# Patient Record
Sex: Female | Born: 1965 | Race: Black or African American | Hispanic: No | Marital: Married | Smoking: Never smoker
Health system: Southern US, Community
[De-identification: ages and names within clinical notes are randomized; demographics above are authoritative.]

## PROBLEM LIST (undated history)

## (undated) DIAGNOSIS — G4700 Insomnia, unspecified: Secondary | ICD-10-CM

## (undated) HISTORY — DX: Insomnia, unspecified: G47.00

---

## 2000-06-05 ENCOUNTER — Other Ambulatory Visit: Admission: RE | Admit: 2000-06-05 | Discharge: 2000-06-05 | Payer: Self-pay | Admitting: Gynecology

## 2001-06-13 ENCOUNTER — Other Ambulatory Visit: Admission: RE | Admit: 2001-06-13 | Discharge: 2001-06-13 | Payer: Self-pay | Admitting: Gynecology

## 2002-06-16 ENCOUNTER — Other Ambulatory Visit: Admission: RE | Admit: 2002-06-16 | Discharge: 2002-06-16 | Payer: Self-pay | Admitting: Gynecology

## 2003-07-08 ENCOUNTER — Other Ambulatory Visit: Admission: RE | Admit: 2003-07-08 | Discharge: 2003-07-08 | Payer: Self-pay | Admitting: Gynecology

## 2003-07-27 ENCOUNTER — Encounter: Admission: RE | Admit: 2003-07-27 | Discharge: 2003-07-27 | Payer: Self-pay | Admitting: Gynecology

## 2004-07-12 ENCOUNTER — Other Ambulatory Visit: Admission: RE | Admit: 2004-07-12 | Discharge: 2004-07-12 | Payer: Self-pay | Admitting: Gynecology

## 2005-05-14 ENCOUNTER — Inpatient Hospital Stay (HOSPITAL_COMMUNITY): Admission: AD | Admit: 2005-05-14 | Discharge: 2005-05-14 | Payer: Self-pay | Admitting: Obstetrics and Gynecology

## 2005-05-28 ENCOUNTER — Inpatient Hospital Stay (HOSPITAL_COMMUNITY): Admission: AD | Admit: 2005-05-28 | Discharge: 2005-05-31 | Payer: Self-pay | Admitting: Obstetrics and Gynecology

## 2005-05-29 ENCOUNTER — Encounter (INDEPENDENT_AMBULATORY_CARE_PROVIDER_SITE_OTHER): Payer: Self-pay | Admitting: Specialist

## 2005-08-23 ENCOUNTER — Other Ambulatory Visit: Admission: RE | Admit: 2005-08-23 | Discharge: 2005-08-23 | Payer: Self-pay | Admitting: Obstetrics and Gynecology

## 2006-12-06 ENCOUNTER — Encounter: Admission: RE | Admit: 2006-12-06 | Discharge: 2006-12-06 | Payer: Self-pay | Admitting: Internal Medicine

## 2008-03-13 ENCOUNTER — Encounter: Admission: RE | Admit: 2008-03-13 | Discharge: 2008-03-13 | Payer: Self-pay | Admitting: Internal Medicine

## 2009-03-30 ENCOUNTER — Encounter: Admission: RE | Admit: 2009-03-30 | Discharge: 2009-03-30 | Payer: Self-pay | Admitting: Obstetrics and Gynecology

## 2010-09-17 ENCOUNTER — Encounter: Payer: Self-pay | Admitting: Gynecology

## 2010-09-18 ENCOUNTER — Encounter: Payer: Self-pay | Admitting: Obstetrics and Gynecology

## 2010-09-19 ENCOUNTER — Encounter: Payer: Self-pay | Admitting: Obstetrics and Gynecology

## 2011-01-13 NOTE — Op Note (Signed)
NAMEELFREDA, BLANCHET             ACCOUNT NO.:  [id number]   MEDICAL RECORD NO.:  [id number]          PATIENT TYPE:  INP   LOCATION:  9147                          FACILITY:  [facility]   PHYSICIAN:  Zenaida Niece, M.D.DATE OF BIRTH:  07-Apr-1966   DATE OF PROCEDURE:  05/29/2005  DATE OF DISCHARGE:                                 OPERATIVE REPORT   PREOPERATIVE DIAGNOSES:  1.  Intrauterine pregnancy at 38 weeks.  2.  Previous cesarean section.  3.  Third trimester vaginal bleeding.  4.  Desires surgical sterility.   POSTOPERATIVE DIAGNOSES:  1.  Intrauterine pregnancy at 38 weeks.  2.  Previous cesarean section.  3.  Third trimester vaginal bleeding.  4.  Desires surgical sterility.   PROCEDURE:  Repeat low transverse cesarean section and bilateral partial  salpingectomy.   SURGEON:  Zenaida Niece, M.D.   ANESTHESIA:  Spinal.   SPECIMENS:  Placenta.   ESTIMATED BLOOD LOSS:  1000 cc.   COMPLICATIONS:  She had bleeding from the placental bed and the right angle  of the uterine incision. Both fallopian tubes were also plump and difficult  to ligate. She delivered a viable female infant with Apgars of 9 and 9,  weight 6 pounds 6 ounces.   PROCEDURE IN DETAIL:  The patient was taken to the operating room and placed  in the sitting position. Dr. Tacy Dura instilled spinal anesthesia and she was  then placed in the dorsal supine position with left lateral tilt. Abdomen  was prepped and draped in the usual sterile fashion and a Foley catheter was  placed. The level of her anesthesia was found to be adequate and her abdomen  was entered via her previous Pfannenstiel incision. The self retaining  abdominal wound retractor was placed and the bladder pushed inferior. A 4 cm  transverse incision was made in a fairly thick lower uterine segment. Once  the amniotic cavity was entered, the incision was extended bilaterally  digitally and with bandage scissors. The fetal vertex was  grasped and  delivered through the incision atraumatically. Mouth and nares were  suctioned. The remainder of the infant then delivered atraumatically. Cord  was doubly clamped and cut and the infant handed to the awaiting pediatric  team. Cord blood was obtained and placenta delivered spontaneously. Uterus  was wiped dry with clean lap pad. Brisk bleeding from the inferior portion  of the incision was controlled with ring forceps. Despite pressure, there  was persistent bleeding from the placental bed posteriorly just above the  cervix. This was controlled with one figure-of-eight suture of #1 chromic.  The uterine incision was then closed in one layer being a running locking  layer with #1 chromic. This achieved hemostasis except for the right angle.  Several sutures of #1 chromic were used to obtain hemostasis at the right  angle. At least a portion of the uterine artery was occluded during the  sutures. Once the incision was felt to be adequately hemostatic, attention  was turned to the tubal ligation.   The left tube was brought to the incision. It had some adhesions to  the  ovary and was fairly thick, so these adhesions were taken down from the  ovary with electrocautery. In doing so, a small hole was made in one of the  large veins. This was controlled with interrupted figure-of-eight sutures of  3-0 Vicryl. The middle portion of the tube was then held up with a Babcock  clamp and a suture of 0 plain gut suture was wrapped around it. A second  suture was wrapped around it to ligate a knuckle of tube. The knuckle of  tube was then removed sharply. The tube promptly pulled out of the suture.  Both ends of fallopian tube were then grasped with Kelly clamps. Each end  was then separately ligated with 0 plain gut suture with adequate  hemostasis. A small amount of bleeding from between the two ends of the tube  was controlled with electrocautery.   Attention was turned to the right  tube. This tube also was fairly thick and  a knuckle of tube was grasped with a Babcock clamp. A 0 plain gut suture was  used to ligate the knuckle with two sutures. The knuckle of tube was then  sharply removed and on this side also, the tube promptly pulled loose from  the suture. Both ends were grasped and ligated separately with 0 plain gut  suture. Bleeding was controlled with these free ties. Careful inspection  revealed no active bleeding. Uterine incision was then again inspected.  Further bleeding from the right angle was controlled with one last suture of  #1 chromic. Bleeding from serosal edges and the inferior portion of the  incision was controlled with electrocautery. The subfascial space was then  irrigated and made hemostatic with electrocautery. Fascia was closed in  running fashion starting at both ends and meeting in the middle.  Subcutaneous tissue was irrigated and made hemostatic with electrocautery.  The skin was then closed with staples. A sterile dressing was then applied.  The patient tolerated the procedure well and was taken to the recovery room  in stable condition. Counts were correct x2, she was given Ancef 1 g after  cord clamp.      Zenaida Niece, M.D.  Electronically Signed     TDM/MEDQ  D:  05/29/2005  T:  05/29/2005  Job:  [id number]

## 2011-01-13 NOTE — Discharge Summary (Signed)
[name], [name]             ACCOUNT NO.:  [id number]   MEDICAL RECORD NO.:  [id number]          PATIENT TYPE:  INP   LOCATION:  9147                          FACILITY:  [facility]   PHYSICIAN:  Zenaida Niece, M.D.DATE OF BIRTH:  1965-11-30   DATE OF ADMISSION:  05/28/2005  DATE OF DISCHARGE:                                 DISCHARGE SUMMARY   ADMISSION DIAGNOSES:  1.  Intrauterine pregnancy at 38 weeks.  2.  Previous cesarean section.  3.  Third trimester bleeding.  4.  Desires surgical sterility.   DISCHARGE DIAGNOSES:  1.  Intrauterine pregnancy at 38 weeks.  2.  Previous cesarean section.  3.  Third trimester bleeding.  4.  Desires surgical sterility.   PROCEDURE:  On May 29, 2005 she had a repeat low transverse cesarean  section and bilateral partial salpingectomy.   COMPLICATIONS:  None.   HISTORY AND PHYSICAL:  Please see the chart for the full history and  physical but briefly, this is a 45 year old black female gravida 2 para 1-0-  0-1 with an EGA of [redacted] weeks who presents with the complaint of brisk vaginal  bleeding. She had been followed in the office and had a resolved placenta  previa and a possible persistent vasa previa. Prenatal care was otherwise  uncomplicated. Prenatal labs:  Blood type is O positive with a negative  antibody screen, RPR nonreactive, rubella immune, hepatitis B surface  antigen negative, gonorrhea and chlamydia negative, HIV was negative, 1-hour  Glucola was 93, and group B strep is negative. Past OB history:  Low-  transverse cesarean section at 40 weeks, baby weighed 7 pounds 7 ounces.  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 Past surgical history:  Cesarean section. Physical  exam:  She was afebrile with stable vital signs. Fetal heart tracing was  reactive. Abdomen was gravid, nontender, with a transverse scar and  estimated fetal weight of 7-and-a-half pounds. Vaginal exam  was deferred but  there was gross blood.   HOSPITAL COURSE:  The patient was admitted and taken to the operating room  where she underwent repeat low transverse cesarean section and tubal  ligation. Estimated blood loss was 1000 cc. She delivered a viable female  infant with Apgars of 9 and 9 that weighed 6 pounds 6 ounces.  Postoperatively, she did well without significant complications. Predelivery  hemoglobin 12.9 and postoperative day #0 was 10.1, and postoperative #1 it  was 8.9. She remained hemodynamically stable and on postpartum #2 requested  discharge home. She was felt to be stable enough for discharge home. Prior  to discharge her staples were removed and Steri-Strips applied and her  incision was healing well.   DISCHARGE INSTRUCTIONS:  Regular diet, pelvic rest, no strenuous activity.  Follow-up is in 2 weeks for an incision check. Medications are Percocet #30  one to two p.o. q.4-6h. p.r.n. pain, Ambien 10 mg #15 one p.o. q.h.s.  p.r.n., and over-the-counter ibuprofen as needed. She is also given our  discharge pamphlet.      Zenaida Niece, M.D.  Electronically  Signed     TDM/MEDQ  D:  05/31/2005  T:  05/31/2005  Job:  [id number]

## 2011-05-05 ENCOUNTER — Other Ambulatory Visit: Payer: Self-pay | Admitting: Obstetrics and Gynecology

## 2011-05-05 DIAGNOSIS — Z1231 Encounter for screening mammogram for malignant neoplasm of breast: Secondary | ICD-10-CM

## 2011-05-22 ENCOUNTER — Ambulatory Visit: Payer: Self-pay

## 2012-06-06 ENCOUNTER — Ambulatory Visit
Admission: RE | Admit: 2012-06-06 | Discharge: 2012-06-06 | Disposition: A | Discharge status: Home / Self Care | Payer: BC Managed Care – PPO | Source: Ambulatory Visit | Attending: Obstetrics and Gynecology | Admitting: Obstetrics and Gynecology

## 2012-06-06 DIAGNOSIS — Z1231 Encounter for screening mammogram for malignant neoplasm of breast: Secondary | ICD-10-CM

## 2013-05-07 ENCOUNTER — Other Ambulatory Visit: Payer: Self-pay

## 2013-05-07 ENCOUNTER — Other Ambulatory Visit: Payer: Self-pay | Admitting: Physical Therapy

## 2013-05-07 DIAGNOSIS — Z1231 Encounter for screening mammogram for malignant neoplasm of breast: Secondary | ICD-10-CM

## 2013-06-09 ENCOUNTER — Ambulatory Visit
Admission: RE | Admit: 2013-06-09 | Discharge: 2013-06-09 | Disposition: A | Discharge status: Home / Self Care | Payer: BC Managed Care – PPO | Source: Ambulatory Visit

## 2013-06-09 DIAGNOSIS — Z1231 Encounter for screening mammogram for malignant neoplasm of breast: Secondary | ICD-10-CM

## 2018-11-13 ENCOUNTER — Other Ambulatory Visit: Payer: Self-pay | Admitting: Obstetrics and Gynecology

## 2018-11-13 DIAGNOSIS — R928 Other abnormal and inconclusive findings on diagnostic imaging of breast: Secondary | ICD-10-CM

## 2018-11-18 ENCOUNTER — Other Ambulatory Visit: Payer: Self-pay

## 2018-11-18 ENCOUNTER — Ambulatory Visit: Payer: Self-pay

## 2018-11-18 ENCOUNTER — Ambulatory Visit
Admission: RE | Admit: 2018-11-18 | Discharge: 2018-11-18 | Disposition: A | Discharge status: Home / Self Care | Payer: BLUE CROSS/BLUE SHIELD | Source: Ambulatory Visit | Attending: Obstetrics and Gynecology | Admitting: Obstetrics and Gynecology

## 2018-11-18 DIAGNOSIS — R928 Other abnormal and inconclusive findings on diagnostic imaging of breast: Secondary | ICD-10-CM

## 2018-11-20 ENCOUNTER — Other Ambulatory Visit: Payer: Self-pay

## 2018-12-11 DIAGNOSIS — F5101 Primary insomnia: Secondary | ICD-10-CM | POA: Diagnosis not present

## 2019-01-23 DIAGNOSIS — F411 Generalized anxiety disorder: Secondary | ICD-10-CM | POA: Diagnosis not present

## 2019-07-18 DIAGNOSIS — Z20828 Contact with and (suspected) exposure to other viral communicable diseases: Secondary | ICD-10-CM | POA: Diagnosis not present

## 2019-08-09 IMAGING — MG DIGITAL DIAGNOSTIC UNILATERAL LEFT MAMMOGRAM WITH TOMO AND CAD
4 series · 4 of 12 positions shown · non-contrast
Comparison: November 07, 2018

CLINICAL DATA: 52-year-old patient recalled from recent 2D
screening mammogram for evaluation of possible asymmetry in the left
breast.

EXAM:
DIGITAL DIAGNOSTIC UNILATERAL LEFT MAMMOGRAM WITH CAD AND TOMO

[L MLO synth-2D]
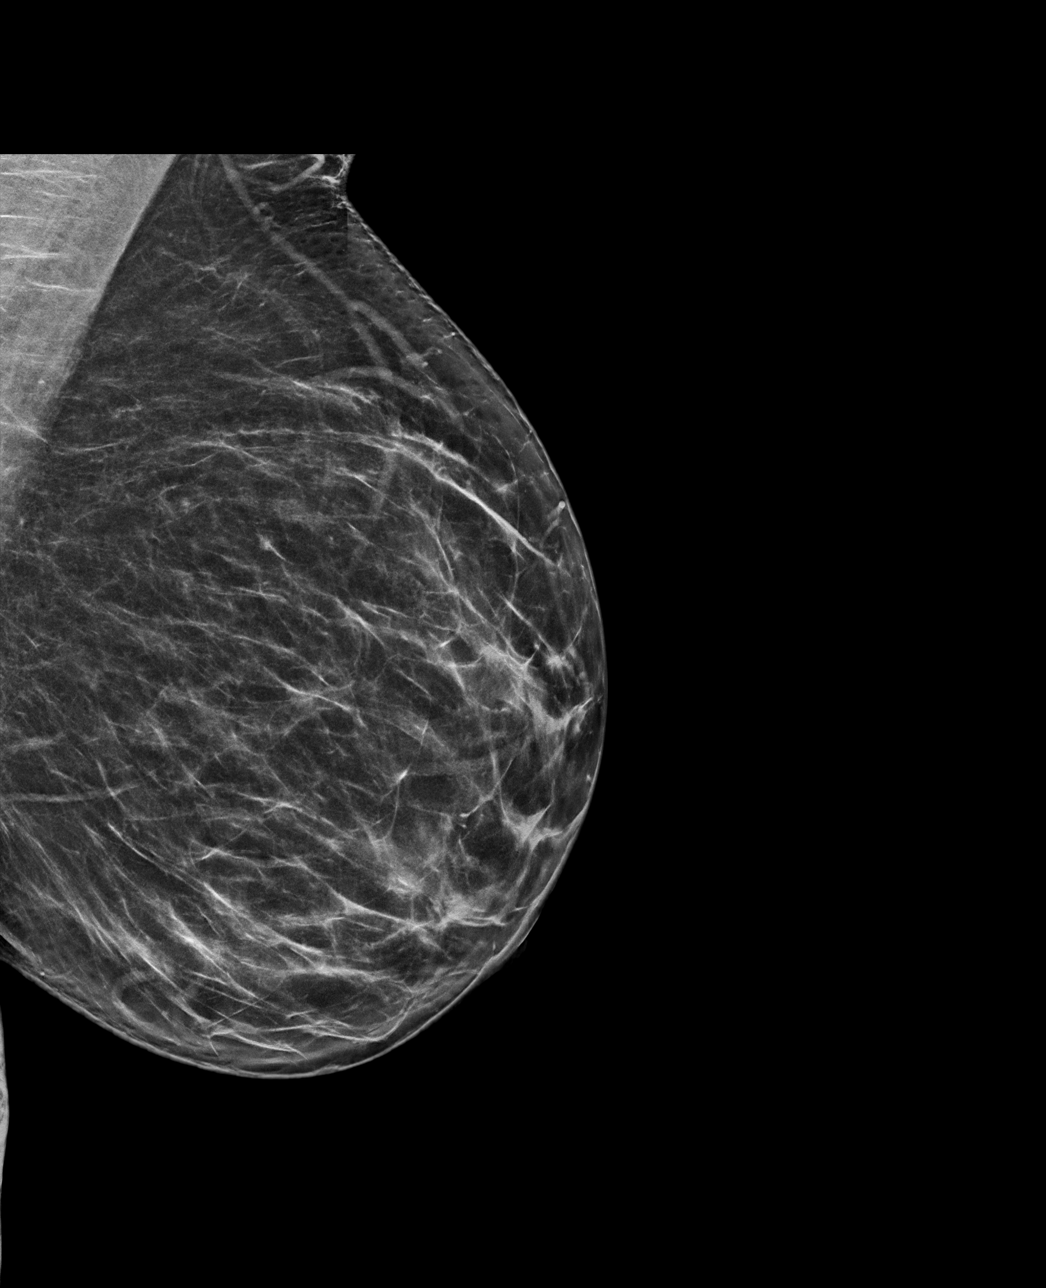

[L CC synth-2D]
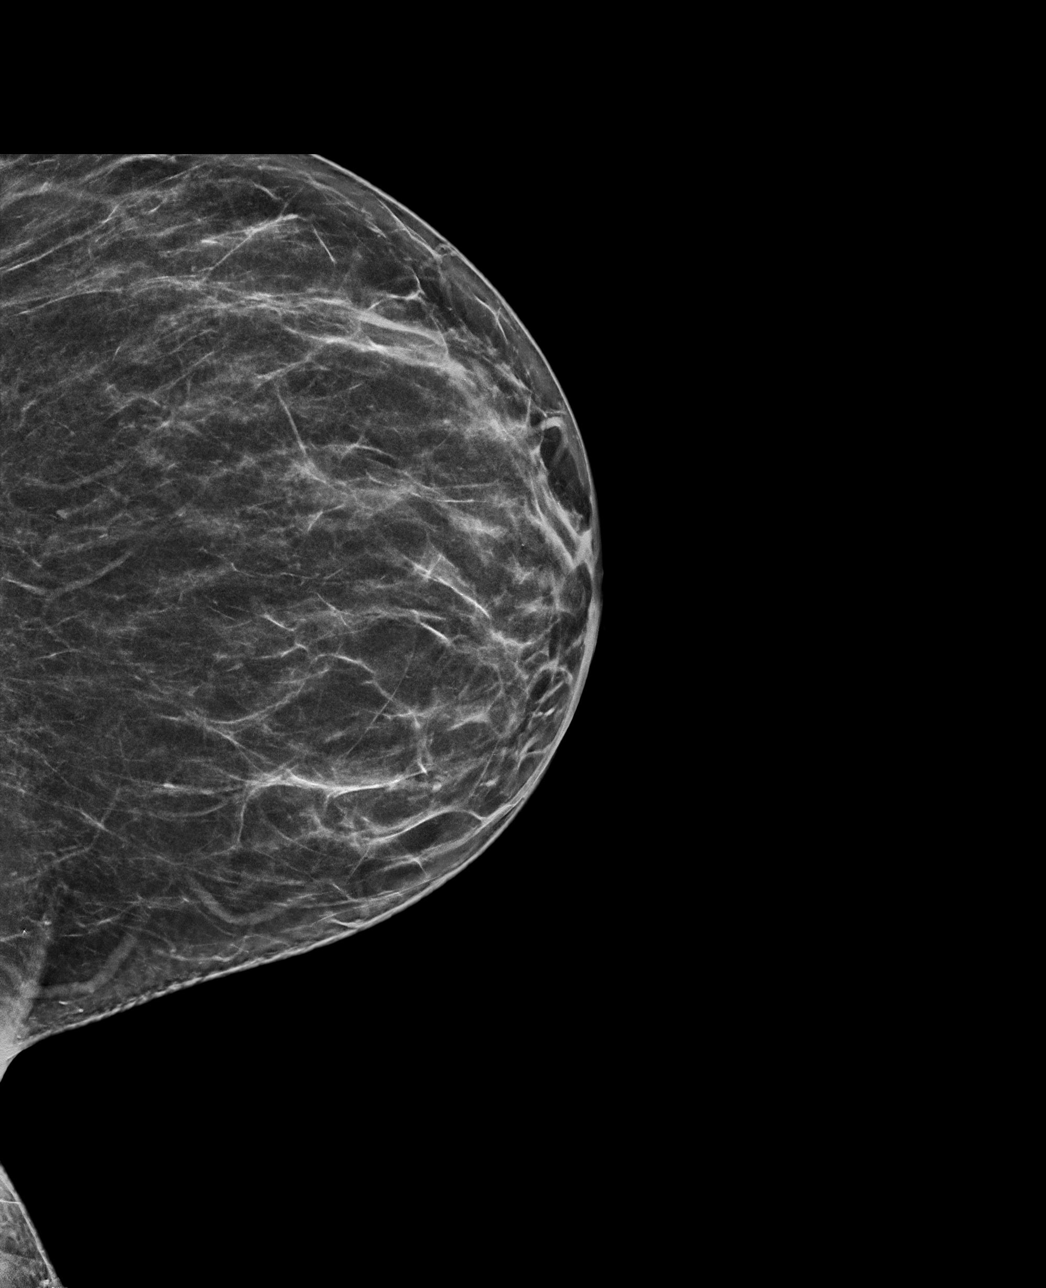

[L CC tomo · tomo slice 35/70.0]
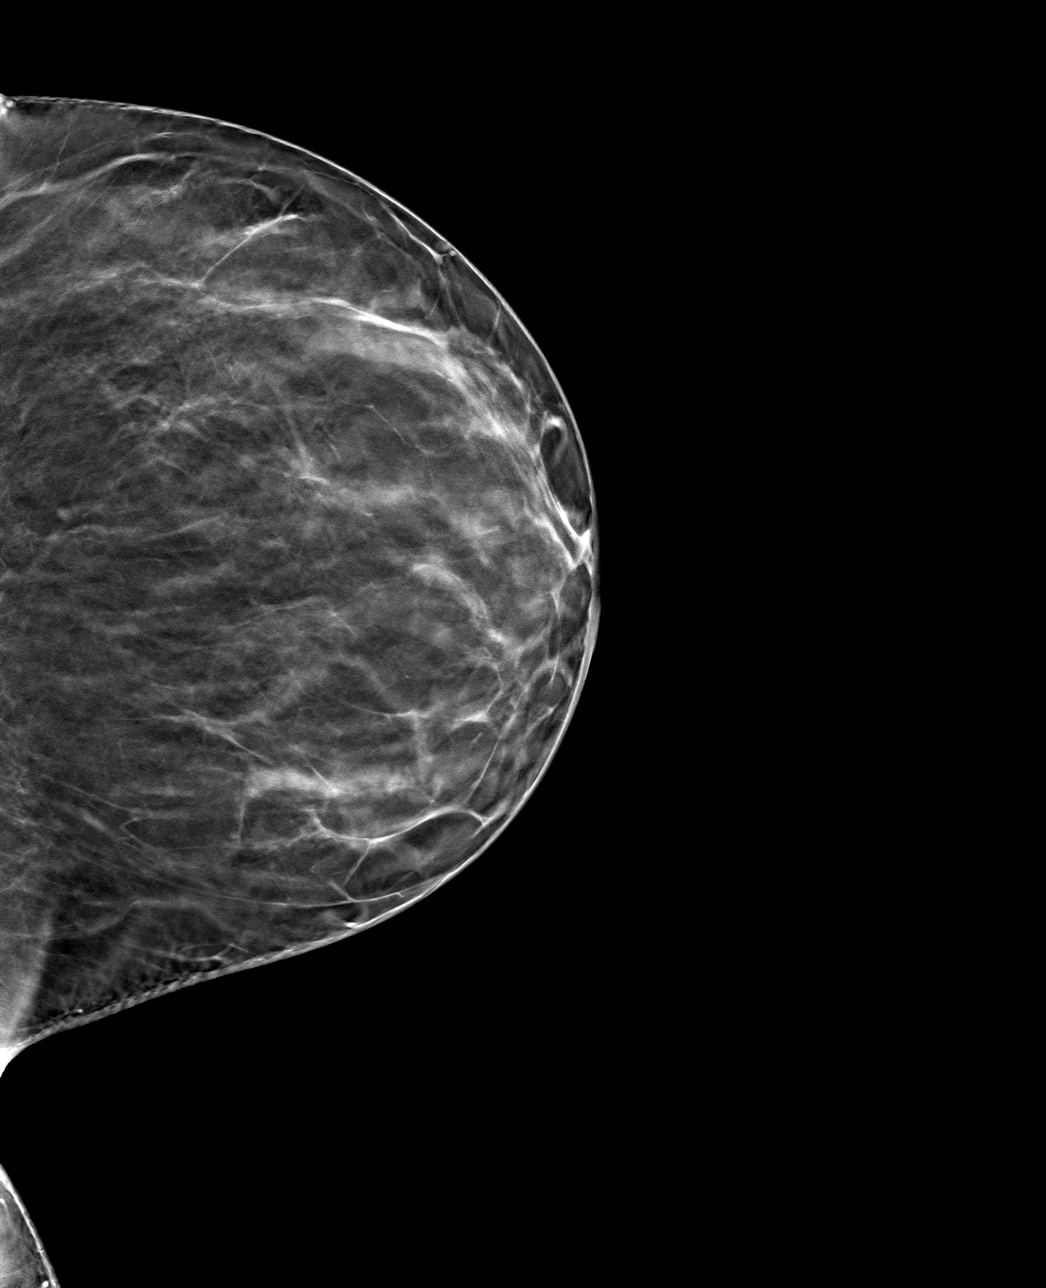

[L MLO tomo · tomo slice 38/75.0]
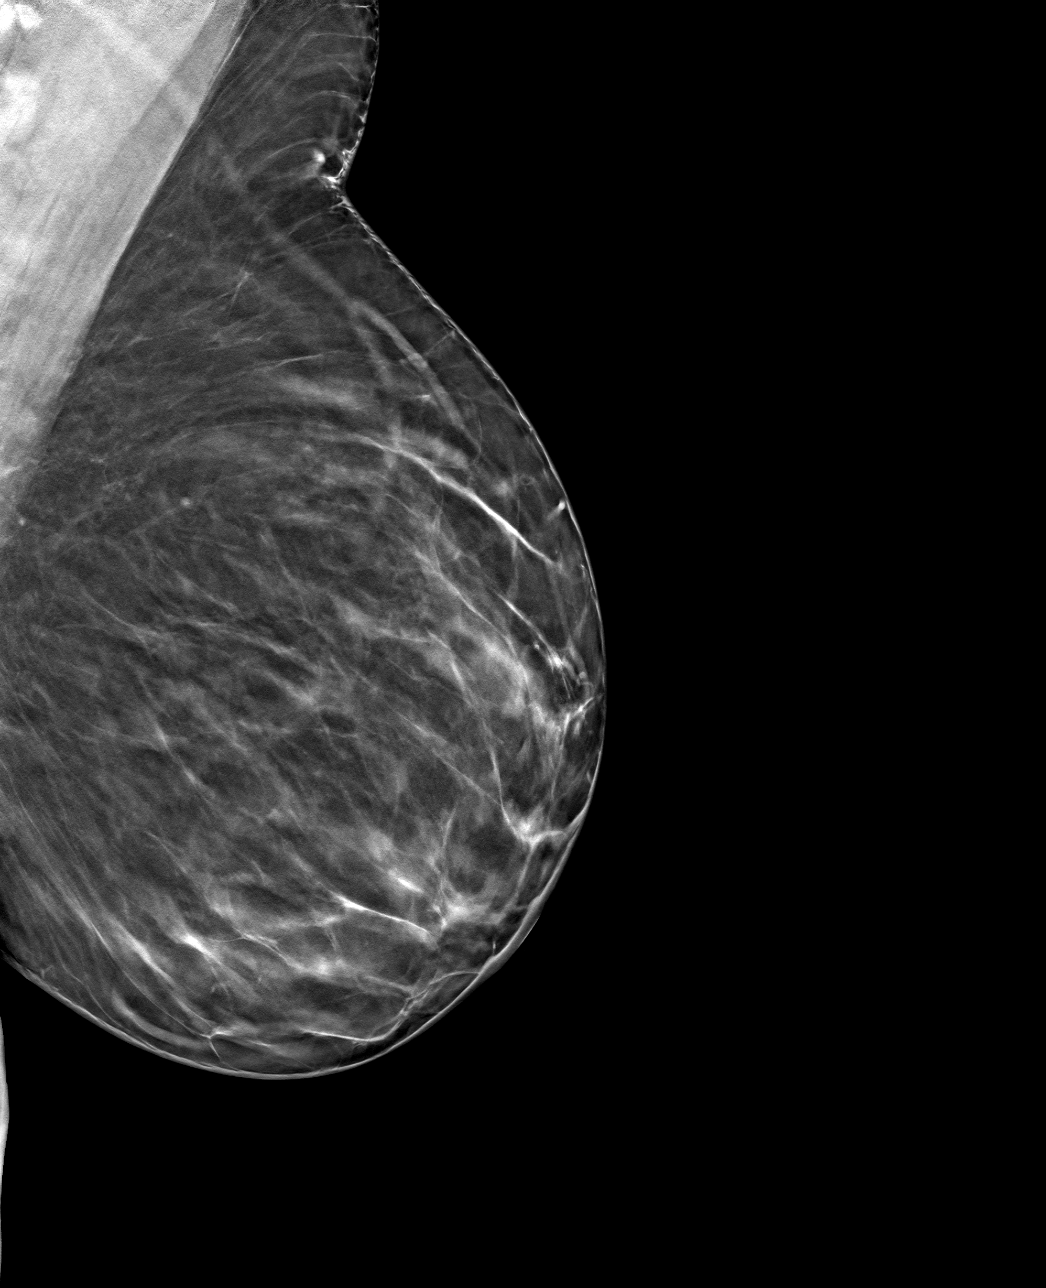

[4 of 12 positions shown; findings below may reference images not displayed]

ACR Breast Density Category b: There are scattered areas of
fibroglandular density.
FINDINGS: Whole breast CC and MLO views with tomography show no persistent
asymmetry in the left breast. There is no mass or distortion. No
suspicious findings.

Mammographic images were processed with CAD.
IMPRESSION: No evidence of malignancy in the left breast.

RECOMMENDATION:
Screening mammogram in one year.(Code:QG-7-QZ6)

I have discussed the findings and recommendations with the patient.
Results were also provided in writing at the conclusion of the
visit. If applicable, a reminder letter will be sent to the patient
regarding the next appointment.

BI-RADS CATEGORY  1: Negative.

## 2019-08-28 DIAGNOSIS — F411 Generalized anxiety disorder: Secondary | ICD-10-CM | POA: Diagnosis not present

## 2019-08-28 DIAGNOSIS — F5101 Primary insomnia: Secondary | ICD-10-CM | POA: Diagnosis not present

## 2019-09-10 DIAGNOSIS — U071 COVID-19: Secondary | ICD-10-CM | POA: Diagnosis not present

## 2019-09-19 DIAGNOSIS — Z20822 Contact with and (suspected) exposure to covid-19: Secondary | ICD-10-CM | POA: Diagnosis not present

## 2019-09-21 DIAGNOSIS — Z20822 Contact with and (suspected) exposure to covid-19: Secondary | ICD-10-CM | POA: Diagnosis not present

## 2019-11-18 DIAGNOSIS — F411 Generalized anxiety disorder: Secondary | ICD-10-CM | POA: Diagnosis not present

## 2019-11-27 DIAGNOSIS — E559 Vitamin D deficiency, unspecified: Secondary | ICD-10-CM

## 2019-11-27 HISTORY — DX: Vitamin D deficiency, unspecified: E55.9

## 2019-11-28 DIAGNOSIS — G4700 Insomnia, unspecified: Secondary | ICD-10-CM | POA: Diagnosis not present

## 2019-11-28 DIAGNOSIS — R030 Elevated blood-pressure reading, without diagnosis of hypertension: Secondary | ICD-10-CM | POA: Diagnosis not present

## 2019-11-28 DIAGNOSIS — B001 Herpesviral vesicular dermatitis: Secondary | ICD-10-CM | POA: Diagnosis not present

## 2019-11-28 DIAGNOSIS — Z634 Disappearance and death of family member: Secondary | ICD-10-CM | POA: Diagnosis not present

## 2019-12-19 ENCOUNTER — Ambulatory Visit: Payer: BLUE CROSS/BLUE SHIELD | Admitting: Family Medicine

## 2019-12-26 ENCOUNTER — Ambulatory Visit (INDEPENDENT_AMBULATORY_CARE_PROVIDER_SITE_OTHER): Payer: Self-pay | Admitting: Family Medicine

## 2019-12-26 ENCOUNTER — Other Ambulatory Visit: Payer: Self-pay

## 2019-12-26 ENCOUNTER — Encounter: Payer: Self-pay | Admitting: Family Medicine

## 2019-12-26 VITALS — BP 159/88 | HR 110 | Temp 97.8°F | Ht 65.0 in | Wt 203.8 lb

## 2019-12-26 DIAGNOSIS — Z7689 Persons encountering health services in other specified circumstances: Secondary | ICD-10-CM

## 2019-12-26 DIAGNOSIS — R8290 Unspecified abnormal findings in urine: Secondary | ICD-10-CM | POA: Diagnosis not present

## 2019-12-26 DIAGNOSIS — F4321 Adjustment disorder with depressed mood: Secondary | ICD-10-CM

## 2019-12-26 DIAGNOSIS — Z0000 Encounter for general adult medical examination without abnormal findings: Secondary | ICD-10-CM | POA: Diagnosis not present

## 2019-12-26 DIAGNOSIS — R030 Elevated blood-pressure reading, without diagnosis of hypertension: Secondary | ICD-10-CM

## 2019-12-26 DIAGNOSIS — Z09 Encounter for follow-up examination after completed treatment for conditions other than malignant neoplasm: Secondary | ICD-10-CM

## 2019-12-26 DIAGNOSIS — F419 Anxiety disorder, unspecified: Secondary | ICD-10-CM

## 2019-12-26 HISTORY — DX: Elevated blood-pressure reading, without diagnosis of hypertension: R03.0

## 2019-12-26 LAB — GLUCOSE, POCT (MANUAL RESULT ENTRY): POC Glucose: 105 mg/dl — AB (ref 70–99)

## 2019-12-26 LAB — POCT URINALYSIS DIPSTICK
Bilirubin, UA: NEGATIVE
Blood, UA: NEGATIVE
Glucose, UA: NEGATIVE
Ketones, UA: NEGATIVE
Nitrite, UA: NEGATIVE
Protein, UA: NEGATIVE
Spec Grav, UA: 1.02 (ref 1.010–1.025)
Urobilinogen, UA: 0.2 E.U./dL
pH, UA: 6 (ref 5.0–8.0)

## 2019-12-26 LAB — POCT GLYCOSYLATED HEMOGLOBIN (HGB A1C): Hemoglobin A1C: 5.2 % (ref 4.0–5.6)

## 2019-12-26 NOTE — Progress Notes (Signed)
Patient [name] and [facility]  New Patient--Establish Care  Subjective:  Patient ID: [name], female    DOB: 05-11-1966  Age: 54 y.o. MRN: [id number]  CC:  Chief Complaint  Patient presents with  . New Patient (Initial Visit)    Est care    HPI [name] is a 54 year old female who presents for Follow Up today.    Past Medical History:  Diagnosis Date  . Elevated blood pressure reading 12/26/2019  . Insomnia      Current Status: This will be her initial office visit with me. She was previously seeing Eugenie Norrie, MD for her PCP needs. Since her last office visit, she is doing well with no complaints. Her last colonoscopy was in 2019 Last Mammogram was in 2020. She recently lost her mother 09/2019, and is having a hard time with her grief. She is extremely tearful and emotional today. She denies fevers, chills, fatigue, recent infections, weight loss, and night sweats. She has not had any headaches, visual changes, dizziness, and falls. No chest pain, heart palpitations, cough and shortness of breath reported. No reports of GI problems such as nausea, vomiting, diarrhea, and constipation. She has no reports of blood in stools, dysuria and hematuria. No depression or anxiety, and denies suicidal ideations, homicidal ideations, or auditory hallucinations. She is taking all medications as prescribed. She denies pain today.    Family History  Problem Relation Age of Onset  . Heart disease Mother   . COPD Father   . Heart disease Sister     Social History   Socioeconomic History  . Marital status: Married    Spouse name: Not on file  . Number of children: Not on file  . Years of education: Not on file  . Highest education level: Not on file  Occupational History  . Not on file  Tobacco Use  . Smoking status: Never Smoker  . Smokeless tobacco: Never Used  Substance and Sexual Activity  . Alcohol use: Yes  . Drug  use: Not Currently  . Sexual activity: Yes    Birth control/protection: I.U.D.  Other Topics Concern  . Not on file  Social History Narrative  . Not on file   Social Determinants of Health   Financial Resource Strain:   . Difficulty of Paying Living Expenses:   Food Insecurity:   . Worried About Charity fundraiser in the Last Year:   . Arboriculturist in the Last Year:   Transportation Needs:   . Film/video editor (Medical):   Marland Kitchen Lack of Transportation (Non-Medical):   Physical Activity:   . Days of Exercise per Week:   . Minutes of Exercise per Session:   Stress:   . Feeling of Stress :   Social Connections:   . Frequency of Communication with Friends and Family:   . Frequency of Social Gatherings with Friends and Family:   . Attends Religious Services:   . Active Member of Clubs or Organizations:   . Attends Archivist Meetings:   Marland Kitchen Marital Status:   Intimate Partner Violence:   . Fear of Current or Ex-Partner:   . Emotionally Abused:   Marland Kitchen Physically Abused:   . Sexually Abused:     Outpatient Medications Prior to Visit  Medication Sig Dispense Refill  . etonogestrel-ethinyl estradiol (NUVARING) 0.12-0.015 MG/24HR vaginal ring NuvaRing 0.12 mg-0.015 mg/24 hr vaginal  INSERT 1 VAGINAL RING VAGINALLY EVERY MONTH    .  zolpidem (AMBIEN) 10 MG tablet Take 10 mg by mouth at bedtime.     No facility-administered medications prior to visit.    No Known Allergies  ROS Review of Systems  Constitutional: Negative.   HENT: Negative.   Eyes: Negative.   Respiratory: Negative.   Cardiovascular: Negative.   Gastrointestinal: Negative.   Endocrine: Negative.   Genitourinary: Negative.   Musculoskeletal: Negative.   Skin: Negative.   Allergic/Immunologic: Negative.   Neurological: Negative.   Hematological: Negative.   Psychiatric/Behavioral: The patient is nervous/anxious.        Very tearful today.       Objective:    Physical Exam   Constitutional: She is oriented to person, place, and time. She appears well-developed and well-nourished.  HENT:  Head: Normocephalic and atraumatic.  Eyes: Conjunctivae are normal.  Cardiovascular: Normal rate, regular rhythm, normal heart sounds and intact distal pulses.  Pulmonary/Chest: Effort normal and breath sounds normal.  Abdominal: Soft. Bowel sounds are normal.  Musculoskeletal:        General: Normal range of motion.     Cervical back: Normal range of motion and neck supple.  Neurological: She is alert and oriented to person, place, and time. She has normal reflexes.  Skin: Skin is warm and dry.  Psychiatric:  Tearful   Nursing note and vitals reviewed.   BP (!) 159/88   Pulse (!) 110   Temp 97.8 F (36.6 C)   Ht 5' 5"  (1.651 m)   Wt 203 lb 12.8 oz (92.4 kg)   SpO2 97%   BMI 33.91 kg/m  Wt Readings from Last 3 Encounters:  12/26/19 203 lb 12.8 oz (92.4 kg)    Health Maintenance Due  Topic Date Due  . HIV Screening  Never done  . COVID-19 Vaccine (1) Never done  . TETANUS/TDAP  Never done  . PAP SMEAR-Modifier  Never done  . MAMMOGRAM  11/18/2017  . COLONOSCOPY  Never done    There are no preventive care reminders to display for this patient.  No results found for: TSH No results found for: WBC, HGB, HCT, MCV, PLT No results found for: NA, K, CHLORIDE, CO2, GLUCOSE, BUN, CREATININE, BILITOT, ALKPHOS, AST, ALT, PROT, ALBUMIN, CALCIUM, ANIONGAP, EGFR, GFR No results found for: CHOL No results found for: HDL No results found for: LDLCALC No results found for: TRIG No results found for: CHOLHDL Lab Results  Component Value Date   HGBA1C 5.2 12/26/2019      Assessment & Plan:   1. Grieving  we will refer to Hospice for grief. - Ambulatory referral to Psychiatry  2. Anxiety - Ambulatory referral to Psychiatry  3. Elevated blood pressure reading Blood pressure elevated today. Monitor.   4. Encounter to establish care  5. Abnormal  urinalysis Results are pending.  - Urine Culture  6. Healthcare maintenance - POCT urinalysis dipstick - POCT glycosylated hemoglobin (Hb A1C) - POCT glucose (manual entry) - CBC with Differential - Comprehensive metabolic panel - Lipid Panel - TSH - Vitamin B12 - Vitamin D, 25-hydroxy  7. Follow up She wil follow up in 1 months.   ,No orders of the defined types were placed in this encounter.   Orders Placed This Encounter  Procedures  . Urine Culture  . CBC with Differential  . Comprehensive metabolic panel  . Lipid Panel  . TSH  . Vitamin B12  . Vitamin D, 25-hydroxy  . Ambulatory referral to Psychiatry  . POCT urinalysis dipstick  . POCT glycosylated  hemoglobin (Hb A1C)  . POCT glucose (manual entry)     Referral Orders     Ambulatory referral to Psychiatry   Kathe Becton,  MSN, FNP-BC [facility] [address] [place], [place] [address] [phone]- fax     Problem List Items Addressed This Visit    None    Visit Diagnoses    Grieving    -  Primary   Relevant Orders   Ambulatory referral to Psychiatry   Anxiety       Relevant Orders   Ambulatory referral to Psychiatry   Elevated blood pressure reading       Encounter to establish care       Abnormal urinalysis       Relevant Orders   Urine Culture   Healthcare maintenance       Relevant Orders   POCT urinalysis dipstick (Completed)   POCT glycosylated hemoglobin (Hb A1C) (Completed)   POCT glucose (manual entry) (Completed)   CBC with Differential   Comprehensive metabolic panel   Lipid Panel   TSH   Vitamin B12   Vitamin D, 25-hydroxy   Follow up          No orders of the defined types were placed in this encounter.   Follow-up: Return in about 1 month (around 01/25/2020).    Azzie Glatter, FNP

## 2019-12-27 LAB — VITAMIN D 25 HYDROXY (VIT D DEFICIENCY, FRACTURES): Vit D, 25-Hydroxy: 19.1 ng/mL — ABNORMAL LOW (ref 30.0–100.0)

## 2019-12-27 LAB — COMPREHENSIVE METABOLIC PANEL
ALT: 14 IU/L (ref 0–32)
AST: 34 IU/L (ref 0–40)
Albumin/Globulin Ratio: 1.4 (ref 1.2–2.2)
Albumin: 4.1 g/dL (ref 3.8–4.9)
Alkaline Phosphatase: 57 IU/L (ref 39–117)
BUN/Creatinine Ratio: 9 (ref 9–23)
BUN: 10 mg/dL (ref 6–24)
Bilirubin Total: 0.3 mg/dL (ref 0.0–1.2)
CO2: 21 mmol/L (ref 20–29)
Calcium: 9.2 mg/dL (ref 8.7–10.2)
Chloride: 105 mmol/L (ref 96–106)
Creatinine, Ser: 1.13 mg/dL — ABNORMAL HIGH (ref 0.57–1.00)
GFR calc Af Amer: 65 mL/min/{1.73_m2} (ref >59)
GFR calc non Af Amer: 56 mL/min/{1.73_m2} — ABNORMAL LOW (ref >59)
Globulin, Total: 2.9 g/dL (ref 1.5–4.5)
Glucose: 88 mg/dL (ref 65–99)
Potassium: 3.9 mmol/L (ref 3.5–5.2)
Sodium: 140 mmol/L (ref 134–144)
Total Protein: 7 g/dL (ref 6.0–8.5)

## 2019-12-27 LAB — CBC WITH DIFFERENTIAL/PLATELET
Basophils Absolute: 0.1 10*3/uL (ref 0.0–0.2)
Basos: 1 %
EOS (ABSOLUTE): 0.4 10*3/uL (ref 0.0–0.4)
Eos: 4 %
Hematocrit: 42.7 % (ref 34.0–46.6)
Hemoglobin: 14.4 g/dL (ref 11.1–15.9)
Immature Grans (Abs): 0 10*3/uL (ref 0.0–0.1)
Immature Granulocytes: 0 %
Lymphocytes Absolute: 3.6 10*3/uL — ABNORMAL HIGH (ref 0.7–3.1)
Lymphs: 35 %
MCH: 32.8 pg (ref 26.6–33.0)
MCHC: 33.7 g/dL (ref 31.5–35.7)
MCV: 97 fL (ref 79–97)
Monocytes Absolute: 0.7 10*3/uL (ref 0.1–0.9)
Monocytes: 6 %
Neutrophils Absolute: 5.7 10*3/uL (ref 1.4–7.0)
Neutrophils: 54 %
Platelets: 351 10*3/uL (ref 150–450)
RBC: 4.39 x10E6/uL (ref 3.77–5.28)
RDW: 13.1 % (ref 11.7–15.4)
WBC: 10.5 10*3/uL (ref 3.4–10.8)

## 2019-12-27 LAB — LIPID PANEL
Chol/HDL Ratio: 2.7 ratio (ref 0.0–4.4)
Cholesterol, Total: 146 mg/dL (ref 100–199)
HDL: 55 mg/dL (ref >39)
LDL Chol Calc (NIH): 68 mg/dL (ref 0–99)
Triglycerides: 135 mg/dL (ref 0–149)
VLDL Cholesterol Cal: 23 mg/dL (ref 5–40)

## 2019-12-27 LAB — VITAMIN B12: Vitamin B-12: 601 pg/mL (ref 232–1245)

## 2019-12-27 LAB — TSH: TSH: 0.955 u[IU]/mL (ref 0.450–4.500)

## 2019-12-28 LAB — URINE CULTURE

## 2019-12-29 ENCOUNTER — Other Ambulatory Visit: Payer: Self-pay | Admitting: Family Medicine

## 2019-12-29 ENCOUNTER — Encounter: Payer: Self-pay | Admitting: Family Medicine

## 2019-12-29 DIAGNOSIS — E559 Vitamin D deficiency, unspecified: Secondary | ICD-10-CM

## 2019-12-29 MED ORDER — VITAMIN D (ERGOCALCIFEROL) 1.25 MG (50000 UNIT) PO CAPS: 50000.0000 [IU] | ORAL_CAPSULE | ORAL | 3 refills | Status: DC

## 2020-01-08 ENCOUNTER — Telehealth: Payer: Self-pay | Admitting: Family Medicine

## 2020-01-08 NOTE — Telephone Encounter (Signed)
Pt called in Xanax you discussed that couldn't be filled until the 15th.

## 2020-01-09 ENCOUNTER — Other Ambulatory Visit: Payer: Self-pay | Admitting: Family Medicine

## 2020-01-09 DIAGNOSIS — F419 Anxiety disorder, unspecified: Secondary | ICD-10-CM

## 2020-01-09 DIAGNOSIS — F4321 Adjustment disorder with depressed mood: Secondary | ICD-10-CM

## 2020-01-09 MED ORDER — ALPRAZOLAM 0.5 MG PO TABS: 0.5000 mg | ORAL_TABLET | Freq: Every evening | ORAL | 0 refills | Status: DC | PRN

## 2020-01-09 NOTE — Telephone Encounter (Signed)
Msg left on voicemail.

## 2020-01-09 NOTE — Telephone Encounter (Signed)
Pt recently lost her mother to COVID. Pt said you suggested the medicine in case she needed it.

## 2020-01-27 ENCOUNTER — Ambulatory Visit: Payer: Self-pay | Admitting: Family Medicine

## 2020-02-03 ENCOUNTER — Ambulatory Visit: Payer: Self-pay | Admitting: Family Medicine

## 2020-02-09 DIAGNOSIS — Z1389 Encounter for screening for other disorder: Secondary | ICD-10-CM | POA: Diagnosis not present

## 2020-02-09 DIAGNOSIS — Z01419 Encounter for gynecological examination (general) (routine) without abnormal findings: Secondary | ICD-10-CM | POA: Diagnosis not present

## 2020-02-09 DIAGNOSIS — Z1231 Encounter for screening mammogram for malignant neoplasm of breast: Secondary | ICD-10-CM | POA: Diagnosis not present

## 2020-02-09 DIAGNOSIS — Z130 Encounter for screening for diseases of the blood and blood-forming organs and certain disorders involving the immune mechanism: Secondary | ICD-10-CM | POA: Diagnosis not present

## 2020-02-09 DIAGNOSIS — Z124 Encounter for screening for malignant neoplasm of cervix: Secondary | ICD-10-CM | POA: Diagnosis not present

## 2020-05-20 ENCOUNTER — Other Ambulatory Visit: Payer: Self-pay | Admitting: Family Medicine

## 2020-05-20 DIAGNOSIS — E559 Vitamin D deficiency, unspecified: Secondary | ICD-10-CM

## 2020-06-29 DIAGNOSIS — Z20822 Contact with and (suspected) exposure to covid-19: Secondary | ICD-10-CM | POA: Diagnosis not present

## 2020-07-12 ENCOUNTER — Other Ambulatory Visit: Payer: Self-pay | Admitting: Family Medicine

## 2020-07-12 DIAGNOSIS — E559 Vitamin D deficiency, unspecified: Secondary | ICD-10-CM

## 2020-07-12 DIAGNOSIS — Z1152 Encounter for screening for COVID-19: Secondary | ICD-10-CM | POA: Diagnosis not present

## 2020-07-14 ENCOUNTER — Ambulatory Visit (INDEPENDENT_AMBULATORY_CARE_PROVIDER_SITE_OTHER): Payer: Self-pay | Admitting: Family Medicine

## 2020-07-14 ENCOUNTER — Other Ambulatory Visit: Payer: Self-pay

## 2020-07-14 DIAGNOSIS — F419 Anxiety disorder, unspecified: Secondary | ICD-10-CM

## 2020-07-14 DIAGNOSIS — Z09 Encounter for follow-up examination after completed treatment for conditions other than malignant neoplasm: Secondary | ICD-10-CM

## 2020-07-14 DIAGNOSIS — R030 Elevated blood-pressure reading, without diagnosis of hypertension: Secondary | ICD-10-CM

## 2020-07-14 DIAGNOSIS — G4700 Insomnia, unspecified: Secondary | ICD-10-CM

## 2020-07-14 DIAGNOSIS — I10 Essential (primary) hypertension: Secondary | ICD-10-CM

## 2020-07-14 DIAGNOSIS — F4321 Adjustment disorder with depressed mood: Secondary | ICD-10-CM

## 2020-07-14 LAB — POCT GLYCOSYLATED HEMOGLOBIN (HGB A1C)
HbA1c POC (<> result, manual entry): 5.1 % (ref 4.0–5.6)
HbA1c, POC (controlled diabetic range): 5.1 % (ref 0.0–7.0)
HbA1c, POC (prediabetic range): 5.1 % — AB (ref 5.7–6.4)
Hemoglobin A1C: 5.1 % (ref 4.0–5.6)

## 2020-07-14 LAB — POCT URINALYSIS DIPSTICK
Bilirubin, UA: NEGATIVE
Glucose, UA: NEGATIVE
Leukocytes, UA: NEGATIVE
Nitrite, UA: NEGATIVE
Protein, UA: NEGATIVE
Spec Grav, UA: >=1.03 — AB (ref 1.010–1.025)
Urobilinogen, UA: 0.2 E.U./dL
pH, UA: 5.5 (ref 5.0–8.0)

## 2020-07-14 LAB — GLUCOSE, POCT (MANUAL RESULT ENTRY): POC Glucose: 149 mg/dl — AB (ref 70–99)

## 2020-07-14 MED ORDER — HYDROCHLOROTHIAZIDE 25 MG PO TABS: 25.0000 mg | ORAL_TABLET | Freq: Every day | ORAL | 1 refills | Status: DC

## 2020-07-14 MED ORDER — ALPRAZOLAM 0.5 MG PO TABS: 0.5000 mg | ORAL_TABLET | Freq: Every evening | ORAL | 0 refills | Status: DC | PRN

## 2020-07-14 NOTE — Progress Notes (Signed)
[facility]   Established Patient Office Visit  Subjective:  Patient ID: [name], female    DOB: 03-08-1966  Age: 54 y.o. MRN: [id number]  CC:  Chief Complaint  Patient presents with  . Follow-up    No sleep, no tatse or smell, dizzness, blurry vision, can't sleep @ nite,can't eat tightness in chest. X5 wks.. Pt also states she had covid-19 in 08/2019.  . Back Pain    X5 wks   HPI [name] is a 54 year old female who presents for Follow Up today.   There are no problems to display for this patient.  Current Status: Since her last office visit, she is doing well with no complaints. At her last and initial visit her blood pressure readings were elevated. She recently lost her Mother 09/2019. Her anxiety is moderate as r/t her grieving. She has had increased intermittent episodes of crying. She has not been able to get any sleep since. She denies visual changes, chest pain, cough, shortness of breath, heart palpitations, and falls. She has occasional headaches and dizziness with position changes. Denies severe headaches, confusion, seizures, double vision, and blurred vision, nausea and vomiting. She denies fevers, chills, fatigue, recent infections, weight loss, and night sweats. Denies GI problems such as diarrhea, and constipation. She has no reports of blood in stools, dysuria and hematuria. She is taking all medications as prescribed. She denies pain today.   Past Medical History:  Diagnosis Date  . Elevated blood pressure reading 12/26/2019  . Insomnia   . Vitamin D deficiency 11/2019    Family History  Problem Relation Age of Onset  . Heart disease Mother   . COPD Father   . Heart disease Sister     Social History   Socioeconomic History  . Marital status: Married    Spouse name: Not on file  . Number of children: Not on file  . Years of education: Not on file  . Highest education level: Not  on file  Occupational History  . Not on file  Tobacco Use  . Smoking status: Never Smoker  . Smokeless tobacco: Never Used  Vaping Use  . Vaping Use: Never used  Substance and Sexual Activity  . Alcohol use: Yes  . Drug use: Not Currently  . Sexual activity: Yes    Birth control/protection: I.U.D.  Other Topics Concern  . Not on file  Social History Narrative  . Not on file   Social Determinants of Health   Financial Resource Strain:   . Difficulty of Paying Living Expenses: Not on file  Food Insecurity:   . Worried About Programme researcher, broadcasting/film/video in the Last Year: Not on file  . Ran Out of Food in the Last Year: Not on file  Transportation Needs:   . Lack of Transportation (Medical): Not on file  . Lack of Transportation (Non-Medical): Not on file  Physical Activity:   . Days of Exercise per Week: Not on file  . Minutes of Exercise per Session: Not on file  Stress:   . Feeling of Stress : Not on file  Social Connections:   . Frequency of Communication with Friends and Family: Not on file  . Frequency of Social Gatherings with Friends and Family: Not on file  . Attends Religious Services: Not on file  . Active Member of Clubs or Organizations: Not on file  . Attends Banker Meetings: Not on file  .  Marital Status: Not on file  Intimate Partner Violence:   . Fear of Current or Ex-Partner: Not on file  . Emotionally Abused: Not on file  . Physically Abused: Not on file  . Sexually Abused: Not on file    Outpatient Medications Prior to Visit  Medication Sig Dispense Refill  . etonogestrel-ethinyl estradiol (NUVARING) 0.12-0.015 MG/24HR vaginal ring NuvaRing 0.12 mg-0.015 mg/24 hr vaginal  INSERT 1 VAGINAL RING VAGINALLY EVERY MONTH (Patient not taking: Reported on 07/14/2020)    . Vitamin D, Ergocalciferol, (DRISDOL) 1.25 MG (50000 UNIT) CAPS capsule Take 1 capsule (50,000 Units total) by mouth every 7 (seven) days. (Patient not taking: Reported on 07/14/2020) 5  capsule 3  . ALPRAZolam (XANAX) 0.5 MG tablet Take 1 tablet (0.5 mg total) by mouth at bedtime as needed for anxiety. (Patient not taking: Reported on 07/14/2020) 30 tablet 0  . zolpidem (AMBIEN) 10 MG tablet Take 10 mg by mouth at bedtime. (Patient not taking: Reported on 07/14/2020)     No facility-administered medications prior to visit.    No Known Allergies  ROS Review of Systems  Constitutional: Negative.   HENT: Negative.   Eyes: Negative.   Respiratory: Negative.   Cardiovascular: Negative.   Gastrointestinal: Negative.   Endocrine: Negative.   Genitourinary: Negative.   Musculoskeletal: Negative.   Skin: Negative.   Allergic/Immunologic: Negative.   Neurological: Positive for dizziness (occasional ) and headaches (occasional ).  Hematological: Negative.   Psychiatric/Behavioral: Negative.    Objective:    Physical Exam Vitals and nursing note reviewed.  Constitutional:      Appearance: Normal appearance.  HENT:     Head: Normocephalic and atraumatic.     Nose: Nose normal.     Mouth/Throat:     Mouth: Mucous membranes are moist.     Pharynx: Oropharynx is clear.  Cardiovascular:     Rate and Rhythm: Normal rate and regular rhythm.     Pulses: Normal pulses.     Heart sounds: Normal heart sounds.  Pulmonary:     Effort: Pulmonary effort is normal.     Breath sounds: Normal breath sounds.  Abdominal:     General: Bowel sounds are normal.     Palpations: Abdomen is soft.  Musculoskeletal:        General: Normal range of motion.     Cervical back: Normal range of motion and neck supple.  Skin:    General: Skin is warm and dry.  Neurological:     General: No focal deficit present.     Mental Status: She is alert and oriented to person, place, and time.  Psychiatric:        Mood and Affect: Mood normal.        Behavior: Behavior normal.        Thought Content: Thought content normal.        Judgment: Judgment normal.     There were no vitals taken  for this visit. Wt Readings from Last 3 Encounters:  12/26/19 203 lb 12.8 oz (92.4 kg)     Health Maintenance Due  Topic Date Due  . Hepatitis C Screening  Never done  . COVID-19 Vaccine (1) Never done  . HIV Screening  Never done  . TETANUS/TDAP  Never done  . PAP SMEAR-Modifier  Never done  . COLONOSCOPY  Never done    There are no preventive care reminders to display for this patient.  Lab Results  Component Value Date   TSH 0.955 12/26/2019  Lab Results  Component Value Date   WBC 10.5 12/26/2019   HGB 14.4 12/26/2019   HCT 42.7 12/26/2019   MCV 97 12/26/2019   PLT 351 12/26/2019   Lab Results  Component Value Date   NA 140 12/26/2019   K 3.9 12/26/2019   CO2 21 12/26/2019   GLUCOSE 88 12/26/2019   BUN 10 12/26/2019   CREATININE 1.13 (H) 12/26/2019   BILITOT 0.3 12/26/2019   ALKPHOS 57 12/26/2019   AST 34 12/26/2019   ALT 14 12/26/2019   PROT 7.0 12/26/2019   ALBUMIN 4.1 12/26/2019   CALCIUM 9.2 12/26/2019   Lab Results  Component Value Date   CHOL 146 12/26/2019   Lab Results  Component Value Date   HDL 55 12/26/2019   Lab Results  Component Value Date   LDLCALC 68 12/26/2019   Lab Results  Component Value Date   TRIG 135 12/26/2019   Lab Results  Component Value Date   CHOLHDL 2.7 12/26/2019   Lab Results  Component Value Date   HGBA1C 5.1 07/14/2020   HGBA1C 5.1 07/14/2020   HGBA1C 5.1 (A) 07/14/2020   HGBA1C 5.1 07/14/2020   Assessment & Plan:   1. Elevated blood pressure reading Blood pressures are elevated today. She denies severe headaches, confusion, seizures, double vision, and blurred vision, nausea and vomiting. She will report to ED if she experiences these symptoms. Patient verbalized understanding.   - Urinalysis Dipstick - POC Glucose (CBG) - POC HgB A1c  2. Hypertension, unspecified type She will continue to take medications as prescribed, to decrease high sodium intake, excessive alcohol intake, increase potassium  intake, smoking cessation, and increase physical activity of at least 30 minutes of cardio activity daily. She will continue to follow Heart Healthy or DASH diet. - hydrochlorothiazide (HYDRODIURIL) 25 MG tablet; Take 1 tablet (25 mg total) by mouth daily.  Dispense: 90 tablet; Refill: 1  3. Anxiety - ALPRAZolam (XANAX) 0.5 MG tablet; Take 1 tablet (0.5 mg total) by mouth at bedtime as needed for anxiety.  Dispense: 30 tablet; Refill: 0  4. Grieving - ALPRAZolam (XANAX) 0.5 MG tablet; Take 1 tablet (0.5 mg total) by mouth at bedtime as needed for anxiety.  Dispense: 30 tablet; Refill: 0  5. Insomnia, unspecified type - ALPRAZolam (XANAX) 0.5 MG tablet; Take 1 tablet (0.5 mg total) by mouth at bedtime as needed for anxiety.  Dispense: 30 tablet; Refill: 0  6. Follow up Follow up in 1 month for Nurse Visit for blood pressure check only. Follow up in 3 months for Office Visit.   Meds ordered this encounter  Medications  . ALPRAZolam (XANAX) 0.5 MG tablet    Sig: Take 1 tablet (0.5 mg total) by mouth at bedtime as needed for anxiety.    Dispense:  30 tablet    Refill:  0  . hydrochlorothiazide (HYDRODIURIL) 25 MG tablet    Sig: Take 1 tablet (25 mg total) by mouth daily.    Dispense:  90 tablet    Refill:  1    Orders Placed This Encounter  Procedures  . Urinalysis Dipstick  . POC Glucose (CBG)  . POC HgB A1c    Referral Orders  No referral(s) requested today    Raliegh Ip,  MSN, FNP-BC [facility] [address] [place], [place] [address] [phone]- fax   Problem List Items Addressed This Visit    None    Visit  Diagnoses    Elevated blood pressure reading    -  Primary   Relevant Orders   Urinalysis Dipstick (Completed)   POC Glucose (CBG) (Completed)   POC HgB A1c (Completed)   Hypertension, unspecified type       Relevant Medications   hydrochlorothiazide  (HYDRODIURIL) 25 MG tablet   Anxiety       Relevant Medications   ALPRAZolam (XANAX) 0.5 MG tablet   Grieving       Relevant Medications   ALPRAZolam (XANAX) 0.5 MG tablet   Insomnia, unspecified type       Relevant Medications   ALPRAZolam (XANAX) 0.5 MG tablet   Follow up          Meds ordered this encounter  Medications  . ALPRAZolam (XANAX) 0.5 MG tablet    Sig: Take 1 tablet (0.5 mg total) by mouth at bedtime as needed for anxiety.    Dispense:  30 tablet    Refill:  0  . hydrochlorothiazide (HYDRODIURIL) 25 MG tablet    Sig: Take 1 tablet (25 mg total) by mouth daily.    Dispense:  90 tablet    Refill:  1    Follow-up: No follow-ups on file.    Kallie Locks, FNP

## 2020-07-19 ENCOUNTER — Encounter: Payer: Self-pay | Admitting: Family Medicine

## 2020-07-31 DIAGNOSIS — Z20822 Contact with and (suspected) exposure to covid-19: Secondary | ICD-10-CM | POA: Diagnosis not present

## 2020-08-13 ENCOUNTER — Ambulatory Visit: Payer: Self-pay

## 2020-08-23 ENCOUNTER — Ambulatory Visit: Payer: Self-pay

## 2020-08-26 DIAGNOSIS — F5101 Primary insomnia: Secondary | ICD-10-CM | POA: Diagnosis not present

## 2020-08-26 DIAGNOSIS — F411 Generalized anxiety disorder: Secondary | ICD-10-CM | POA: Diagnosis not present

## 2020-09-04 DIAGNOSIS — Z20822 Contact with and (suspected) exposure to covid-19: Secondary | ICD-10-CM | POA: Diagnosis not present

## 2020-10-04 ENCOUNTER — Telehealth: Payer: Self-pay | Admitting: Family Medicine

## 2020-10-07 ENCOUNTER — Other Ambulatory Visit: Payer: Self-pay | Admitting: Family Medicine

## 2020-10-07 DIAGNOSIS — F4321 Adjustment disorder with depressed mood: Secondary | ICD-10-CM

## 2020-10-07 DIAGNOSIS — F419 Anxiety disorder, unspecified: Secondary | ICD-10-CM

## 2020-10-07 DIAGNOSIS — G4700 Insomnia, unspecified: Secondary | ICD-10-CM

## 2020-10-13 ENCOUNTER — Encounter: Payer: Self-pay | Admitting: Family Medicine

## 2020-10-13 ENCOUNTER — Ambulatory Visit (INDEPENDENT_AMBULATORY_CARE_PROVIDER_SITE_OTHER): Payer: BC Managed Care – PPO | Admitting: Family Medicine

## 2020-10-13 ENCOUNTER — Other Ambulatory Visit: Payer: Self-pay

## 2020-10-13 VITALS — BP 132/85 | HR 88 | Ht 65.0 in | Wt 197.0 lb

## 2020-10-13 DIAGNOSIS — I10 Essential (primary) hypertension: Secondary | ICD-10-CM | POA: Diagnosis not present

## 2020-10-13 DIAGNOSIS — G4700 Insomnia, unspecified: Secondary | ICD-10-CM | POA: Diagnosis not present

## 2020-10-13 DIAGNOSIS — F419 Anxiety disorder, unspecified: Secondary | ICD-10-CM

## 2020-10-13 DIAGNOSIS — R7989 Other specified abnormal findings of blood chemistry: Secondary | ICD-10-CM | POA: Diagnosis not present

## 2020-10-13 DIAGNOSIS — E559 Vitamin D deficiency, unspecified: Secondary | ICD-10-CM

## 2020-10-13 DIAGNOSIS — Z09 Encounter for follow-up examination after completed treatment for conditions other than malignant neoplasm: Secondary | ICD-10-CM

## 2020-10-13 DIAGNOSIS — F4321 Adjustment disorder with depressed mood: Secondary | ICD-10-CM

## 2020-10-13 MED ORDER — TRAZODONE HCL 50 MG PO TABS: 50.0000 mg | ORAL_TABLET | Freq: Every evening | ORAL | 3 refills | Status: AC | PRN

## 2020-10-13 NOTE — Progress Notes (Signed)
[facility]   Established Patient Office Visit  Subjective:  Patient ID: [name], female    DOB: 1966-07-29  Age: 55 y.o. MRN: [id number]  CC:  Chief Complaint  Patient presents with  . Follow-up    3 follow up , discuss  lab  kidney level     HPI [name] is a 55 year old female who presents for Follow Up today.    There are no problems to display for this patient.   Current Status: Since her last office visit she states that she has not been taking her Hypertensive medication as prescribed. She denies visual changes, chest pain, cough, shortness of breath, heart palpitations, and falls. She has occasional headaches and dizziness with position changes. Denies severe headaches, confusion, seizures, double vision, and blurred vision, nausea and vomiting. She states that her anxiety is continues to be elevated r/t recent loss of her mother, and she has been unable to sleep. She denies suicidal ideations, homicidal ideations, or auditory hallucinations. Referral to Psychiatry was placed, but denied. Patient states that she has not received call from Psych referral. She denies fevers, chills, fatigue, recent infections, weight loss, and night sweats. Denies GI problems such as nausea, vomiting, diarrhea, and constipation. She has no reports of blood in stools, dysuria and hematuria. She denies pain today.   Past Medical History:  Diagnosis Date  . Elevated blood pressure reading 12/26/2019  . Insomnia   . Vitamin D deficiency 11/2019    Past Surgical History:  Procedure Laterality Date  . CESAREAN SECTION     x2    Family History  Problem Relation Age of Onset  . Heart disease Mother   . COPD Father   . Heart disease Sister     Social History   Socioeconomic History  . Marital status: Married    Spouse name: Not on file  . Number of children: Not on file  . Years of education: Not on file  .  Highest education level: Not on file  Occupational History  . Not on file  Tobacco Use  . Smoking status: Never Smoker  . Smokeless tobacco: Never Used  Vaping Use  . Vaping Use: Never used  Substance and Sexual Activity  . Alcohol use: Yes  . Drug use: Not Currently  . Sexual activity: Yes    Birth control/protection: I.U.D.  Other Topics Concern  . Not on file  Social History Narrative  . Not on file   Social Determinants of Health   Financial Resource Strain: Not on file  Food Insecurity: Not on file  Transportation Needs: Not on file  Physical Activity: Not on file  Stress: Not on file  Social Connections: Not on file  Intimate Partner Violence: Not on file    Outpatient Medications Prior to Visit  Medication Sig Dispense Refill  . ALPRAZolam (XANAX) 0.5 MG tablet TAKE 1 TABLET BY MOUTH AT BEDTIME AS NEEDED FOR ANXIETY. 30 tablet 0  . hydrochlorothiazide (HYDRODIURIL) 25 MG tablet Take 1 tablet (25 mg total) by mouth daily. (Patient not taking: Reported on 10/13/2020) 90 tablet 1  . Vitamin D, Ergocalciferol, (DRISDOL) 1.25 MG (50000 UNIT) CAPS capsule TAKE 1 CAPSULE (50,000 UNITS TOTAL) BY MOUTH EVERY 7 (SEVEN) DAYS. (Patient not taking: Reported on 10/13/2020) 5 capsule 3  . etonogestrel-ethinyl estradiol (NUVARING) 0.12-0.015 MG/24HR vaginal ring NuvaRing 0.12 mg-0.015 mg/24 hr vaginal  INSERT 1 VAGINAL RING VAGINALLY EVERY MONTH (Patient not taking:  Reported on 07/14/2020)     No facility-administered medications prior to visit.    No Known Allergies  ROS Review of Systems  Constitutional: Negative.   HENT: Negative.   Eyes: Negative.   Respiratory: Negative.   Cardiovascular: Negative.   Gastrointestinal: Negative.   Endocrine: Negative.   Genitourinary: Negative.   Musculoskeletal: Negative.   Skin: Negative.   Allergic/Immunologic: Negative.   Neurological: Positive for dizziness (occassional ) and headaches (occasional ).  Hematological: Negative.    Psychiatric/Behavioral: Negative.       Objective:    Physical Exam Vitals and nursing note reviewed.  Constitutional:      Appearance: Normal appearance.  HENT:     Head: Normocephalic and atraumatic.     Nose: Nose normal.     Mouth/Throat:     Mouth: Mucous membranes are moist.     Pharynx: Oropharynx is clear.  Cardiovascular:     Rate and Rhythm: Normal rate and regular rhythm.     Pulses: Normal pulses.     Heart sounds: Normal heart sounds.  Pulmonary:     Effort: Pulmonary effort is normal.     Breath sounds: Normal breath sounds.  Abdominal:     General: Bowel sounds are normal.     Palpations: Abdomen is soft.  Musculoskeletal:        General: Normal range of motion.     Cervical back: Normal range of motion and neck supple.  Skin:    General: Skin is warm.  Neurological:     General: No focal deficit present.     Mental Status: She is alert and oriented to person, place, and time.  Psychiatric:        Mood and Affect: Mood normal.        Behavior: Behavior normal.        Thought Content: Thought content normal.        Judgment: Judgment normal.     BP 132/85 (BP Location: Left Arm, Cuff Size: Large)   Pulse 88   Ht 5\' 5"  (1.651 m)   Wt 197 lb (89.4 kg)   BMI 32.78 kg/m  Wt Readings from Last 3 Encounters:  10/13/20 197 lb (89.4 kg)  12/26/19 203 lb 12.8 oz (92.4 kg)     Health Maintenance Due  Topic Date Due  . Hepatitis C Screening  Never done  . COVID-19 Vaccine (1) Never done  . HIV Screening  Never done  . TETANUS/TDAP  Never done  . PAP SMEAR-Modifier  Never done  . COLONOSCOPY (Pts 45-54yrs Insurance coverage will need to be confirmed)  Never done    There are no preventive care reminders to display for this patient.  Lab Results  Component Value Date   TSH 0.955 12/26/2019   Lab Results  Component Value Date   WBC 10.5 12/26/2019   HGB 14.4 12/26/2019   HCT 42.7 12/26/2019   MCV 97 12/26/2019   PLT 351 12/26/2019   Lab  Results  Component Value Date   NA 140 12/26/2019   K 3.9 12/26/2019   CO2 21 12/26/2019   GLUCOSE 88 12/26/2019   BUN 10 12/26/2019   CREATININE 0.92 10/13/2020   BILITOT 0.3 12/26/2019   ALKPHOS 57 12/26/2019   AST 34 12/26/2019   ALT 14 12/26/2019   PROT 7.0 12/26/2019   ALBUMIN 4.1 12/26/2019   CALCIUM 9.2 12/26/2019   Lab Results  Component Value Date   CHOL 146 12/26/2019   Lab Results  Component Value Date   HDL 55 12/26/2019   Lab Results  Component Value Date   LDLCALC 68 12/26/2019   Lab Results  Component Value Date   TRIG 135 12/26/2019   Lab Results  Component Value Date   CHOLHDL 2.7 12/26/2019   Lab Results  Component Value Date   HGBA1C 5.1 07/14/2020   HGBA1C 5.1 07/14/2020   HGBA1C 5.1 (A) 07/14/2020   HGBA1C 5.1 07/14/2020    Assessment & Plan:   1. Insomnia, unspecified type We will initiate Trazodone today to aid with sleep. We will also assess previously sent Psych referral and resend if needed.  - traZODone (DESYREL) 50 MG tablet; Take 1 tablet (50 mg total) by mouth at bedtime as needed for sleep.  Dispense: 30 tablet; Refill: 3  2. Vitamin D deficiency - VITAMIN D 25 Hydroxy (Vit-D Deficiency, Fractures)  3. Anxiety  4. Hypertension, unspecified type She will continue to take medications as prescribed, to decrease high sodium intake, excessive alcohol intake, increase potassium intake, smoking cessation, and increase physical activity of at least 30 minutes of cardio activity daily. She will continue to follow Heart Healthy or DASH diet.  5. Grieving  6. Elevated serum creatinine - Microalbumin/Creatinine Ratio, Urine - Creatinine, serum  7. Follow up She will follow up in 6 months.   Meds ordered this encounter  Medications  . traZODone (DESYREL) 50 MG tablet    Sig: Take 1 tablet (50 mg total) by mouth at bedtime as needed for sleep.    Dispense:  30 tablet    Refill:  3   Orders Placed This Encounter  Procedures   . Microalbumin/Creatinine Ratio, Urine  . Creatinine, serum  . VITAMIN D 25 Hydroxy (Vit-D Deficiency, Fractures)   Referral Orders  No referral(s) requested today    Raliegh Ip, MSN, ANE, FNP-BC [facility]/Internal Medicine/Sick[facility] Cell Center [facility] [address] [place], [place] [address] [phone] fax  Problem List Items Addressed This Visit   None   Visit Diagnoses    Insomnia, unspecified type    -  Primary   Relevant Medications   traZODone (DESYREL) 50 MG tablet   Vitamin D deficiency       Relevant Orders   VITAMIN D 25 Hydroxy (Vit-D Deficiency, Fractures) (Completed)   Anxiety       Relevant Medications   traZODone (DESYREL) 50 MG tablet   Hypertension, unspecified type       Grieving       Elevated serum creatinine       Relevant Orders   Microalbumin/Creatinine Ratio, Urine   Creatinine, serum (Completed)   Follow up          Meds ordered this encounter  Medications  . traZODone (DESYREL) 50 MG tablet    Sig: Take 1 tablet (50 mg total) by mouth at bedtime as needed for sleep.    Dispense:  30 tablet    Refill:  3    Follow-up: No follow-ups on file.    Kallie Locks, FNP

## 2020-10-14 ENCOUNTER — Encounter: Payer: Self-pay | Admitting: Family Medicine

## 2020-10-14 LAB — MICROALBUMIN / CREATININE URINE RATIO
Creatinine, Urine: 103.8 mg/dL
Microalb/Creat Ratio: 3 mg/g creat (ref 0–29)
Microalbumin, Urine: 3.2 ug/mL

## 2020-10-14 LAB — CREATININE, SERUM
Creatinine, Ser: 0.92 mg/dL (ref 0.57–1.00)
GFR calc Af Amer: 83 mL/min/{1.73_m2} (ref >59)
GFR calc non Af Amer: 72 mL/min/{1.73_m2} (ref >59)

## 2020-10-14 LAB — VITAMIN D 25 HYDROXY (VIT D DEFICIENCY, FRACTURES): Vit D, 25-Hydroxy: 30.1 ng/mL (ref 30.0–100.0)

## 2020-10-15 ENCOUNTER — Other Ambulatory Visit: Payer: Self-pay | Admitting: Family Medicine

## 2020-10-15 DIAGNOSIS — F419 Anxiety disorder, unspecified: Secondary | ICD-10-CM

## 2020-10-15 DIAGNOSIS — F4321 Adjustment disorder with depressed mood: Secondary | ICD-10-CM

## 2020-11-10 ENCOUNTER — Other Ambulatory Visit: Payer: Self-pay | Admitting: Family Medicine

## 2020-11-10 DIAGNOSIS — F419 Anxiety disorder, unspecified: Secondary | ICD-10-CM

## 2020-11-10 DIAGNOSIS — G4700 Insomnia, unspecified: Secondary | ICD-10-CM

## 2020-11-10 DIAGNOSIS — F4321 Adjustment disorder with depressed mood: Secondary | ICD-10-CM

## 2020-11-10 DIAGNOSIS — E559 Vitamin D deficiency, unspecified: Secondary | ICD-10-CM

## 2020-11-10 NOTE — Telephone Encounter (Signed)
Is this okay to refill? 

## 2020-11-15 DIAGNOSIS — F419 Anxiety disorder, unspecified: Secondary | ICD-10-CM | POA: Diagnosis not present

## 2020-11-15 DIAGNOSIS — G4700 Insomnia, unspecified: Secondary | ICD-10-CM | POA: Diagnosis not present

## 2021-02-23 ENCOUNTER — Other Ambulatory Visit: Payer: Self-pay | Admitting: Nurse Practitioner

## 2021-02-23 DIAGNOSIS — I10 Essential (primary) hypertension: Secondary | ICD-10-CM

## 2021-02-23 MED ORDER — HYDROCHLOROTHIAZIDE 25 MG PO TABS: 25.0000 mg | ORAL_TABLET | Freq: Every day | ORAL | 1 refills | Status: AC

## 2021-03-22 ENCOUNTER — Other Ambulatory Visit: Payer: Self-pay

## 2021-03-22 DIAGNOSIS — E559 Vitamin D deficiency, unspecified: Secondary | ICD-10-CM

## 2021-03-22 MED ORDER — VITAMIN D (ERGOCALCIFEROL) 1.25 MG (50000 UNIT) PO CAPS: 50000.0000 [IU] | ORAL_CAPSULE | ORAL | 3 refills | Status: DC

## 2022-01-06 ENCOUNTER — Other Ambulatory Visit: Payer: Self-pay | Admitting: Family Medicine

## 2022-01-06 DIAGNOSIS — E559 Vitamin D deficiency, unspecified: Secondary | ICD-10-CM

## 2022-03-04 ENCOUNTER — Other Ambulatory Visit: Payer: Self-pay | Admitting: Family Medicine

## 2022-03-04 DIAGNOSIS — E559 Vitamin D deficiency, unspecified: Secondary | ICD-10-CM

## 2022-07-07 ENCOUNTER — Other Ambulatory Visit: Payer: Self-pay | Admitting: Family Medicine

## 2022-07-07 DIAGNOSIS — E559 Vitamin D deficiency, unspecified: Secondary | ICD-10-CM

## 2022-08-12 ENCOUNTER — Other Ambulatory Visit: Payer: Self-pay | Admitting: Family Medicine

## 2022-08-12 DIAGNOSIS — E559 Vitamin D deficiency, unspecified: Secondary | ICD-10-CM

## 2022-09-14 ENCOUNTER — Other Ambulatory Visit: Payer: Self-pay | Admitting: Family Medicine

## 2022-09-14 DIAGNOSIS — E559 Vitamin D deficiency, unspecified: Secondary | ICD-10-CM

## 2022-10-02 ENCOUNTER — Other Ambulatory Visit: Payer: Self-pay | Admitting: Family Medicine

## 2022-10-02 DIAGNOSIS — E559 Vitamin D deficiency, unspecified: Secondary | ICD-10-CM

## 2022-11-29 ENCOUNTER — Other Ambulatory Visit: Payer: Self-pay | Admitting: Family Medicine

## 2022-11-29 DIAGNOSIS — E559 Vitamin D deficiency, unspecified: Secondary | ICD-10-CM

## 2023-03-15 ENCOUNTER — Other Ambulatory Visit: Payer: Self-pay | Admitting: Family Medicine

## 2023-03-15 DIAGNOSIS — E559 Vitamin D deficiency, unspecified: Secondary | ICD-10-CM
